# Patient Record
Sex: Female | Born: 1950 | Race: White | Hispanic: No
Health system: Southern US, Community
[De-identification: ages and names within clinical notes are randomized; demographics above are authoritative.]

## PROBLEM LIST (undated history)

## (undated) DIAGNOSIS — R002 Palpitations: Secondary | ICD-10-CM

---

## 2008-05-03 ENCOUNTER — Encounter: Admission: RE | Admit: 2008-05-03 | Discharge: 2008-05-03 | Payer: Self-pay | Admitting: Family Medicine

## 2008-08-24 ENCOUNTER — Other Ambulatory Visit: Admission: RE | Admit: 2008-08-24 | Discharge: 2008-08-24 | Payer: Self-pay | Admitting: Family Medicine

## 2016-10-01 DIAGNOSIS — H25813 Combined forms of age-related cataract, bilateral: Secondary | ICD-10-CM | POA: Diagnosis not present

## 2016-10-01 DIAGNOSIS — H04123 Dry eye syndrome of bilateral lacrimal glands: Secondary | ICD-10-CM | POA: Diagnosis not present

## 2016-10-01 DIAGNOSIS — H5213 Myopia, bilateral: Secondary | ICD-10-CM | POA: Diagnosis not present

## 2017-04-15 ENCOUNTER — Emergency Department (HOSPITAL_COMMUNITY)
Admission: EM | Admit: 2017-04-15 | Discharge: 2017-04-15 | Disposition: A | Discharge status: Home / Self Care | Payer: PPO | Attending: Emergency Medicine | Admitting: Emergency Medicine

## 2017-04-15 ENCOUNTER — Encounter (HOSPITAL_COMMUNITY): Payer: Self-pay | Admitting: *Deleted

## 2017-04-15 DIAGNOSIS — R002 Palpitations: Secondary | ICD-10-CM | POA: Diagnosis present

## 2017-04-15 DIAGNOSIS — Z79899 Other long term (current) drug therapy: Secondary | ICD-10-CM | POA: Diagnosis not present

## 2017-04-15 DIAGNOSIS — I471 Supraventricular tachycardia: Secondary | ICD-10-CM | POA: Diagnosis not present

## 2017-04-15 HISTORY — DX: Palpitations: R00.2

## 2017-04-15 NOTE — ED Provider Notes (Signed)
[facility] EMERGENCY DEPARTMENT Provider Note  CSN: [id number] Arrival date & time: 04/15/17 1324  Chief Complaint(s) SVT  HPI Kelli Mcdaniel is a 66 y.o. female with a history of SVT presents to the emergency department with palpitations.  Symptoms started 2 hours prior to arrival.  Similar to her prior SVT episodes.  Denies any chest pain, shortness of breath, headache, recent fevers or infections.  No nausea or vomiting.  No abdominal pain.  No alleviating or aggravating factors.  She denies any other physical complaints.  HPI  Past Medical History Past Medical History:  Diagnosis Date  . Palpitations    There are no active problems to display for this patient.  Home Medication(s) Prior to Admission medications   Medication Sig Start Date End Date Taking? Authorizing Provider  Multiple Vitamin (MULTIVITAMIN) capsule Take 1 capsule by mouth daily.   Yes [provider]  multivitamin-lutein (OCUVITE-LUTEIN) CAPS capsule Take 1 capsule by mouth daily.   Yes [provider]  omega-3 acid ethyl esters (LOVAZA) 1 g capsule Take 1 g by mouth daily.   Yes [provider]                                                                                                                                    Past Surgical History History reviewed. No pertinent surgical history. Family History No family history on file.  Social History Social History  Substance Use Topics  . Smoking status: Not on file  . Smokeless tobacco: Not on file  . Alcohol use Not on file   Allergies Patient has no known allergies.  Review of Systems Review of Systems All other systems are reviewed and are negative for acute change except as noted in the HPI  Physical Exam Vital Signs  I have reviewed the triage vital signs BP 128/86 (BP Location: Left Arm)   Pulse (!) 182   Temp 97.7 F (36.5 C) (Oral)   Resp 16   Ht 5\' 8"  (1.727 m)   Wt 86.2 kg (190 lb)    SpO2 94%   BMI 28.89 kg/m   Physical Exam  Constitutional: She is oriented to person, place, and time. She appears well-developed and well-nourished. No distress.  HENT:  Head: Normocephalic and atraumatic.  Nose: Nose normal.  Eyes: Pupils are equal, round, and reactive to light. Conjunctivae and EOM are normal. Right eye exhibits no discharge. Left eye exhibits no discharge. No scleral icterus.  Neck: Normal range of motion. Neck supple.  Cardiovascular: Regular rhythm.  Tachycardia present.  Exam reveals no gallop and no friction rub.   No murmur heard. Pulmonary/Chest: Effort normal and breath sounds normal. No stridor. No respiratory distress. She has no rales.  Abdominal: Soft. She exhibits no distension. There is no tenderness.  Musculoskeletal: She exhibits no edema or tenderness.  Neurological: She is alert and oriented to person, place, and time.  Skin: Skin is warm and dry. No rash noted. She is not diaphoretic. No erythema.  Psychiatric: She has a normal mood and affect.  Vitals reviewed.   ED Results and Treatments Labs (all labs ordered are listed, but only abnormal results are displayed) Labs Reviewed - No data to display                                                                                                                       EKG  EKG Interpretation  Date/Time:  Tuesday April 15 2017 18:29:26 EDT Ventricular Rate:  182 PR Interval:    QRS Duration: 92 QT Interval:  278 QTC Calculation: 483 R Axis:   -27 Text Interpretation:  Supraventricular tachycardia Marked ST abnormality, possible inferolateral subendocardial injury Abnormal ECG Confirmed by Drema Pryardama, Grecia Lynk [phone]([id number]) on 04/15/2017 6:53:04 PM       EKG Interpretation  Date/Time:  Tuesday April 15 2017 18:55:10 EDT Ventricular Rate:  92 PR Interval:    QRS Duration: 95 QT Interval:  352 QTC Calculation: 436 R Axis:   -28 Text Interpretation:  Sinus rhythm Borderline left axis deviation  Anterior infarct, old resolved SVT Confirmed by Drema Pryardama, Janissa Bertram [phone] on 04/15/2017 8:25:09 PM      Radiology No results found. Pertinent labs & imaging results that were available during my care of the patient were reviewed by me and considered in my medical decision making (see chart for details).  Medications Ordered in ED Medications - No data to display                                                                                                                                  Procedures Procedures  (including critical care time)  Medical Decision Making / ED Course I have reviewed the nursing notes for this encounter and the patient's prior records (if available in EHR or on provided paperwork).    Confirmed SVT with EKG.  Converted to normal sinus rhythm with vagal maneuvers (Trendelenburg position) repeat EKG with normal sinus rhythm without evidence of acute ischemia, dysrhythmias, interval changes.  Monitored without recurrence.  The patient is safe for discharge with strict return precautions.   Final Clinical Impression(s) / ED Diagnoses Final diagnoses:  SVT (supraventricular tachycardia) (HCC)    Disposition: Discharge  Condition: Good  I have discussed the results, Dx and Tx plan with the patient who expressed understanding and agree(s) with the plan.  Discharge instructions discussed at great length. The patient was given strict return precautions who verbalized understanding of the instructions. No further questions at time of discharge.    New Prescriptions   No medications on file    Follow Up: Cardiology/primary care provider   As needed     This chart was dictated using voice recognition software.  Despite best efforts to proofread,  errors can occur which can change the documentation meaning.   Nira Conn, MD 04/15/17 2025

## 2017-04-15 NOTE — ED Triage Notes (Signed)
To ED for eval of palpitations since 1730. States this happens sometimes but it resolves on it's own. Skin w/d, resp e/u

## 2017-07-30 DIAGNOSIS — R030 Elevated blood-pressure reading, without diagnosis of hypertension: Secondary | ICD-10-CM | POA: Diagnosis not present

## 2017-07-30 DIAGNOSIS — Z136 Encounter for screening for cardiovascular disorders: Secondary | ICD-10-CM | POA: Diagnosis not present

## 2017-07-30 DIAGNOSIS — Z0000 Encounter for general adult medical examination without abnormal findings: Secondary | ICD-10-CM | POA: Diagnosis not present

## 2019-02-17 DIAGNOSIS — H25813 Combined forms of age-related cataract, bilateral: Secondary | ICD-10-CM | POA: Diagnosis not present

## 2019-02-17 DIAGNOSIS — H524 Presbyopia: Secondary | ICD-10-CM | POA: Diagnosis not present

## 2019-02-17 DIAGNOSIS — H5213 Myopia, bilateral: Secondary | ICD-10-CM | POA: Diagnosis not present

## 2019-04-08 DIAGNOSIS — R030 Elevated blood-pressure reading, without diagnosis of hypertension: Secondary | ICD-10-CM | POA: Diagnosis not present

## 2019-04-08 DIAGNOSIS — Z0000 Encounter for general adult medical examination without abnormal findings: Secondary | ICD-10-CM | POA: Diagnosis not present

## 2019-04-08 DIAGNOSIS — E559 Vitamin D deficiency, unspecified: Secondary | ICD-10-CM | POA: Diagnosis not present

## 2019-04-08 DIAGNOSIS — E7800 Pure hypercholesterolemia, unspecified: Secondary | ICD-10-CM | POA: Diagnosis not present

## 2020-04-11 DIAGNOSIS — R7309 Other abnormal glucose: Secondary | ICD-10-CM | POA: Diagnosis not present

## 2020-04-11 DIAGNOSIS — Z1211 Encounter for screening for malignant neoplasm of colon: Secondary | ICD-10-CM | POA: Diagnosis not present

## 2020-04-11 DIAGNOSIS — E559 Vitamin D deficiency, unspecified: Secondary | ICD-10-CM | POA: Diagnosis not present

## 2020-04-11 DIAGNOSIS — E2839 Other primary ovarian failure: Secondary | ICD-10-CM | POA: Diagnosis not present

## 2020-04-11 DIAGNOSIS — Z7189 Other specified counseling: Secondary | ICD-10-CM | POA: Diagnosis not present

## 2020-04-11 DIAGNOSIS — R030 Elevated blood-pressure reading, without diagnosis of hypertension: Secondary | ICD-10-CM | POA: Diagnosis not present

## 2020-04-11 DIAGNOSIS — Z23 Encounter for immunization: Secondary | ICD-10-CM | POA: Diagnosis not present

## 2020-04-11 DIAGNOSIS — E7800 Pure hypercholesterolemia, unspecified: Secondary | ICD-10-CM | POA: Diagnosis not present

## 2020-04-11 DIAGNOSIS — Z0000 Encounter for general adult medical examination without abnormal findings: Secondary | ICD-10-CM | POA: Diagnosis not present

## 2020-04-13 DIAGNOSIS — Z1211 Encounter for screening for malignant neoplasm of colon: Secondary | ICD-10-CM | POA: Diagnosis not present

## 2020-04-25 ENCOUNTER — Other Ambulatory Visit: Payer: Self-pay | Admitting: Family Medicine

## 2020-04-25 DIAGNOSIS — E2839 Other primary ovarian failure: Secondary | ICD-10-CM

## 2020-04-26 ENCOUNTER — Other Ambulatory Visit: Payer: Self-pay | Admitting: Family Medicine

## 2020-04-26 DIAGNOSIS — Z1231 Encounter for screening mammogram for malignant neoplasm of breast: Secondary | ICD-10-CM

## 2020-04-27 ENCOUNTER — Ambulatory Visit
Admission: RE | Admit: 2020-04-27 | Discharge: 2020-04-27 | Disposition: A | Discharge status: Home / Self Care | Payer: PPO | Source: Ambulatory Visit | Attending: Family Medicine | Admitting: Family Medicine

## 2020-04-27 ENCOUNTER — Other Ambulatory Visit: Payer: Self-pay

## 2020-04-27 DIAGNOSIS — Z1231 Encounter for screening mammogram for malignant neoplasm of breast: Secondary | ICD-10-CM | POA: Diagnosis not present

## 2020-05-02 ENCOUNTER — Other Ambulatory Visit: Payer: Self-pay | Admitting: Family Medicine

## 2020-05-02 DIAGNOSIS — R928 Other abnormal and inconclusive findings on diagnostic imaging of breast: Secondary | ICD-10-CM

## 2020-05-15 DIAGNOSIS — R945 Abnormal results of liver function studies: Secondary | ICD-10-CM | POA: Diagnosis not present

## 2020-05-16 ENCOUNTER — Other Ambulatory Visit: Payer: Self-pay

## 2020-05-16 ENCOUNTER — Ambulatory Visit
Admission: RE | Admit: 2020-05-16 | Discharge: 2020-05-16 | Disposition: A | Discharge status: Home / Self Care | Payer: PPO | Source: Ambulatory Visit | Attending: Family Medicine | Admitting: Family Medicine

## 2020-05-16 DIAGNOSIS — R922 Inconclusive mammogram: Secondary | ICD-10-CM | POA: Diagnosis not present

## 2020-05-16 DIAGNOSIS — N6012 Diffuse cystic mastopathy of left breast: Secondary | ICD-10-CM | POA: Diagnosis not present

## 2020-05-16 DIAGNOSIS — R928 Other abnormal and inconclusive findings on diagnostic imaging of breast: Secondary | ICD-10-CM

## 2020-08-07 ENCOUNTER — Ambulatory Visit
Admission: RE | Admit: 2020-08-07 | Discharge: 2020-08-07 | Disposition: A | Discharge status: Home / Self Care | Payer: PPO | Source: Ambulatory Visit | Attending: Family Medicine | Admitting: Family Medicine

## 2020-08-07 ENCOUNTER — Other Ambulatory Visit: Payer: PPO

## 2020-08-07 ENCOUNTER — Other Ambulatory Visit: Payer: Self-pay

## 2020-08-07 DIAGNOSIS — E2839 Other primary ovarian failure: Secondary | ICD-10-CM

## 2020-08-07 DIAGNOSIS — Z780 Asymptomatic menopausal state: Secondary | ICD-10-CM | POA: Diagnosis not present

## 2020-08-07 DIAGNOSIS — M85852 Other specified disorders of bone density and structure, left thigh: Secondary | ICD-10-CM | POA: Diagnosis not present

## 2020-10-11 DIAGNOSIS — R7303 Prediabetes: Secondary | ICD-10-CM | POA: Diagnosis not present

## 2020-10-11 DIAGNOSIS — R945 Abnormal results of liver function studies: Secondary | ICD-10-CM | POA: Diagnosis not present

## 2020-10-11 DIAGNOSIS — E7800 Pure hypercholesterolemia, unspecified: Secondary | ICD-10-CM | POA: Diagnosis not present

## 2020-12-14 DIAGNOSIS — Z20822 Contact with and (suspected) exposure to covid-19: Secondary | ICD-10-CM | POA: Diagnosis not present

## 2020-12-14 DIAGNOSIS — U071 COVID-19: Secondary | ICD-10-CM | POA: Diagnosis not present

## 2021-04-18 DIAGNOSIS — R7989 Other specified abnormal findings of blood chemistry: Secondary | ICD-10-CM | POA: Diagnosis not present

## 2021-04-18 DIAGNOSIS — R945 Abnormal results of liver function studies: Secondary | ICD-10-CM | POA: Diagnosis not present

## 2021-04-18 DIAGNOSIS — E7800 Pure hypercholesterolemia, unspecified: Secondary | ICD-10-CM | POA: Diagnosis not present

## 2021-04-18 DIAGNOSIS — R7303 Prediabetes: Secondary | ICD-10-CM | POA: Diagnosis not present

## 2021-04-18 DIAGNOSIS — E559 Vitamin D deficiency, unspecified: Secondary | ICD-10-CM | POA: Diagnosis not present

## 2021-04-20 DIAGNOSIS — R7303 Prediabetes: Secondary | ICD-10-CM | POA: Diagnosis not present

## 2021-04-20 DIAGNOSIS — Z1211 Encounter for screening for malignant neoplasm of colon: Secondary | ICD-10-CM | POA: Diagnosis not present

## 2021-04-20 DIAGNOSIS — E7800 Pure hypercholesterolemia, unspecified: Secondary | ICD-10-CM | POA: Diagnosis not present

## 2021-04-20 DIAGNOSIS — R945 Abnormal results of liver function studies: Secondary | ICD-10-CM | POA: Diagnosis not present

## 2021-04-20 DIAGNOSIS — E559 Vitamin D deficiency, unspecified: Secondary | ICD-10-CM | POA: Diagnosis not present

## 2021-04-20 DIAGNOSIS — R030 Elevated blood-pressure reading, without diagnosis of hypertension: Secondary | ICD-10-CM | POA: Diagnosis not present

## 2021-04-20 DIAGNOSIS — Z0000 Encounter for general adult medical examination without abnormal findings: Secondary | ICD-10-CM | POA: Diagnosis not present

## 2021-04-27 ENCOUNTER — Other Ambulatory Visit (HOSPITAL_COMMUNITY): Payer: Self-pay | Admitting: Family Medicine

## 2021-05-16 ENCOUNTER — Ambulatory Visit (HOSPITAL_COMMUNITY)
Admission: RE | Admit: 2021-05-16 | Discharge: 2021-05-16 | Disposition: A | Discharge status: Home / Self Care | Payer: Self-pay | Source: Ambulatory Visit | Attending: Family Medicine | Admitting: Family Medicine

## 2021-05-16 ENCOUNTER — Other Ambulatory Visit: Payer: Self-pay

## 2021-05-16 DIAGNOSIS — Z136 Encounter for screening for cardiovascular disorders: Secondary | ICD-10-CM | POA: Insufficient documentation

## 2021-05-17 DIAGNOSIS — I10 Essential (primary) hypertension: Secondary | ICD-10-CM | POA: Diagnosis not present

## 2023-05-16 DIAGNOSIS — Z1212 Encounter for screening for malignant neoplasm of rectum: Secondary | ICD-10-CM | POA: Diagnosis not present

## 2023-05-19 DIAGNOSIS — R7303 Prediabetes: Secondary | ICD-10-CM | POA: Diagnosis not present

## 2023-05-19 DIAGNOSIS — Z1211 Encounter for screening for malignant neoplasm of colon: Secondary | ICD-10-CM | POA: Diagnosis not present

## 2023-05-19 DIAGNOSIS — R7989 Other specified abnormal findings of blood chemistry: Secondary | ICD-10-CM | POA: Diagnosis not present

## 2023-05-19 DIAGNOSIS — I10 Essential (primary) hypertension: Secondary | ICD-10-CM | POA: Diagnosis not present

## 2023-05-19 DIAGNOSIS — E7800 Pure hypercholesterolemia, unspecified: Secondary | ICD-10-CM | POA: Diagnosis not present

## 2023-05-19 DIAGNOSIS — E559 Vitamin D deficiency, unspecified: Secondary | ICD-10-CM | POA: Diagnosis not present

## 2023-05-19 DIAGNOSIS — R030 Elevated blood-pressure reading, without diagnosis of hypertension: Secondary | ICD-10-CM | POA: Diagnosis not present

## 2023-05-19 DIAGNOSIS — Z0000 Encounter for general adult medical examination without abnormal findings: Secondary | ICD-10-CM | POA: Diagnosis not present

## 2023-05-19 DIAGNOSIS — M8589 Other specified disorders of bone density and structure, multiple sites: Secondary | ICD-10-CM | POA: Diagnosis not present

## 2023-05-21 ENCOUNTER — Other Ambulatory Visit: Payer: Self-pay | Admitting: Family Medicine

## 2023-05-21 DIAGNOSIS — M8589 Other specified disorders of bone density and structure, multiple sites: Secondary | ICD-10-CM

## 2023-11-14 IMAGING — CT CT CARDIAC CORONARY ARTERY CALCIUM SCORE
3 series · 14 of 20 positions shown, 15 images · non-contrast
Comparison: None.
COMPARISON: None.

Addendum:
EXAM:
OVER-READ INTERPRETATION  CT CHEST

The following report is an over-read performed by radiologist Dr.
Isaura Ning [REDACTED] on 05/16/2021. This
over-read does not include interpretation of cardiac or coronary
anatomy or pathology. The coronary calcium score interpretation by
the cardiologist is attached.
CLINICAL DATA: Cardiovascular Disease Risk stratification
Coronary Calcium Score
TECHNIQUE: A gated, non-contrast computed tomography scan of the heart was
performed using 3mm slice thickness. Axial images were analyzed on a
dedicated workstation. Calcium scoring of the coronary arteries was
performed using the Agatston method.

[Series 3: ax ca scr 70% (id) · axial · 0.32mm/px · z∈[-240,-150]mm · 6 of 64 slices shown]
[im 10/64  vessel]
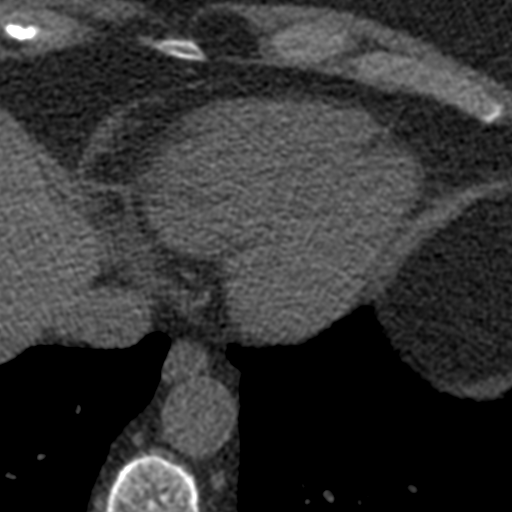
[im 19/64  vessel]
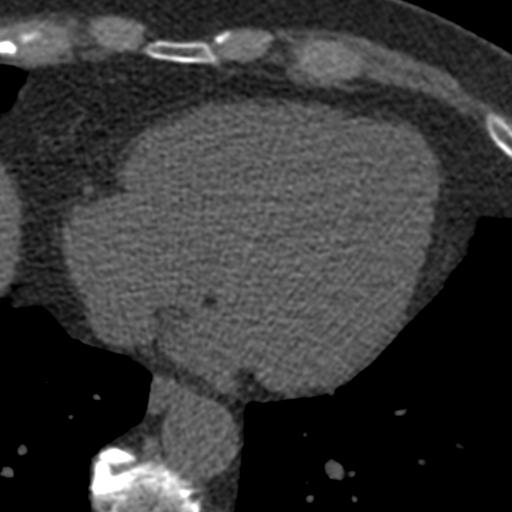
[im 28/64  vessel]
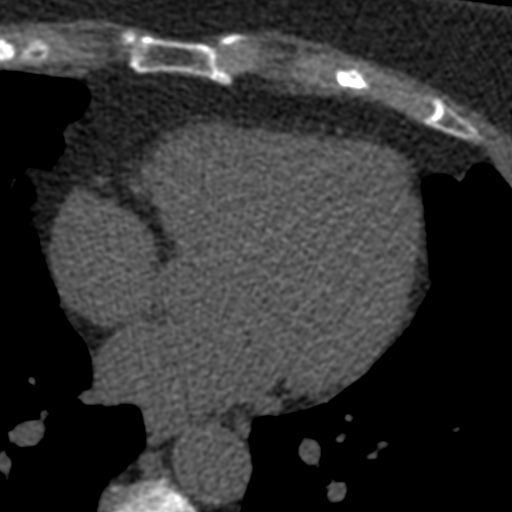
[im 37/64  vessel]
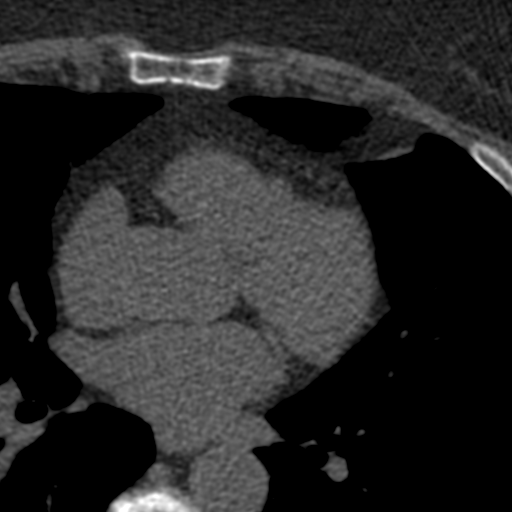
[im 46/64  vessel]
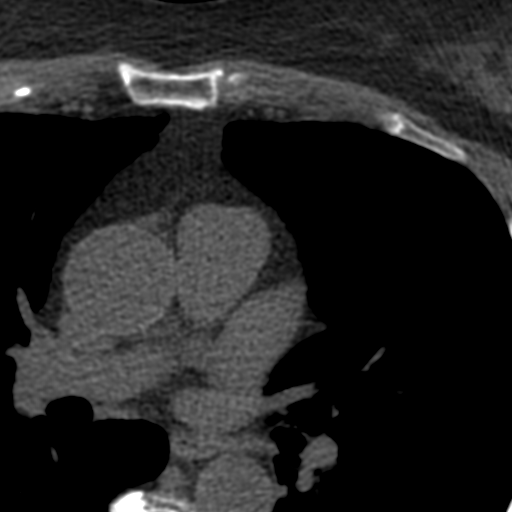
[im 55/64  vessel]
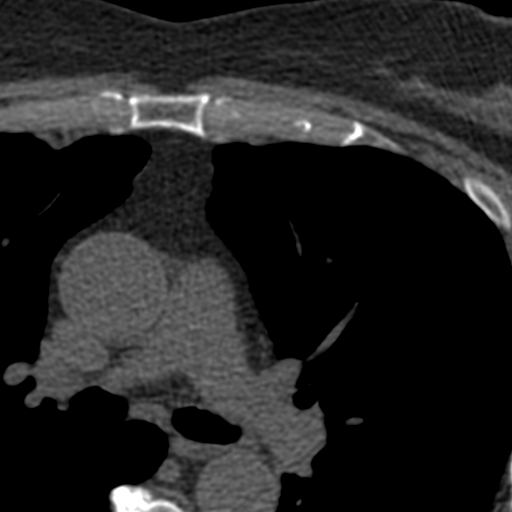

[Series 4: ax lung · axial · 0.70mm/px · z∈[-232,-160]mm · 4 of 42 slices shown]
[im 9/42  lung]
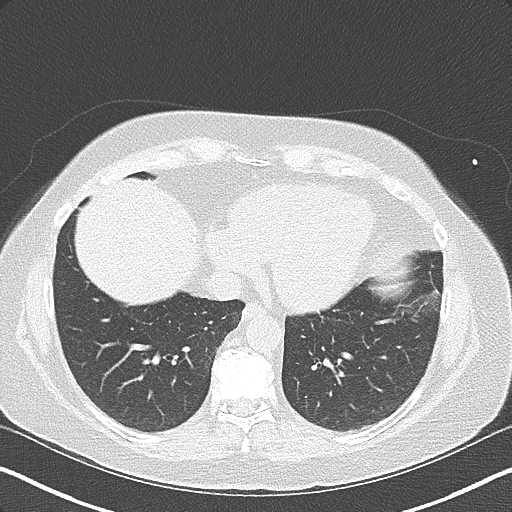
[im 17/42  lung]
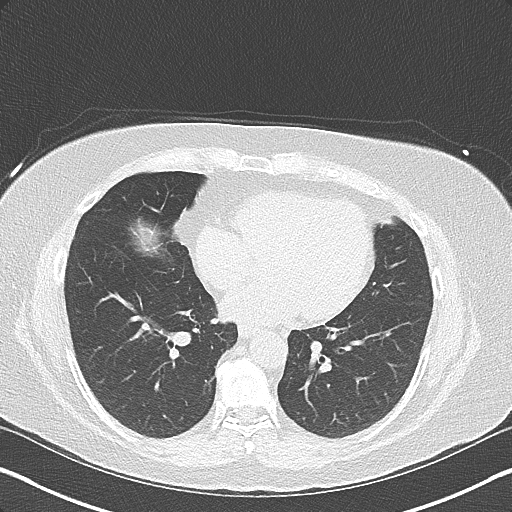
[im 25/42  lung]
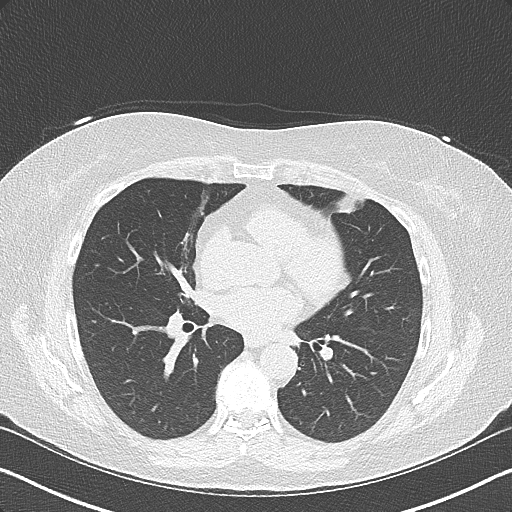
[im 33/42  lung]
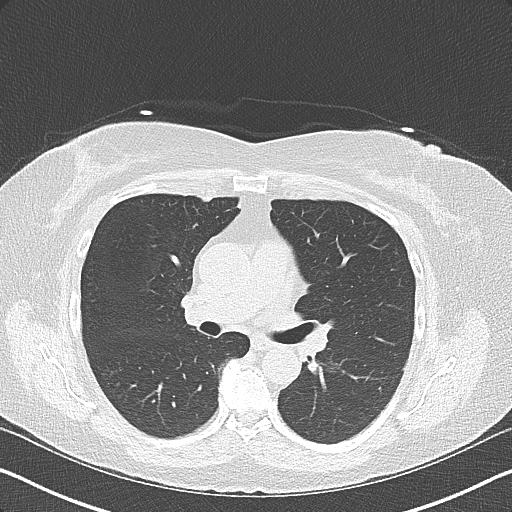

[Series 5: ax st · axial · 0.69mm/px · z∈[-232,-160]mm · 4 of 42 slices shown, 5 images]
[im 9/42  vessel]
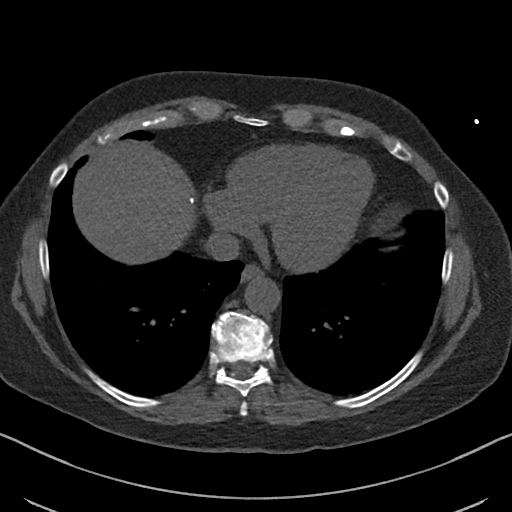
[im 9/42  lung]
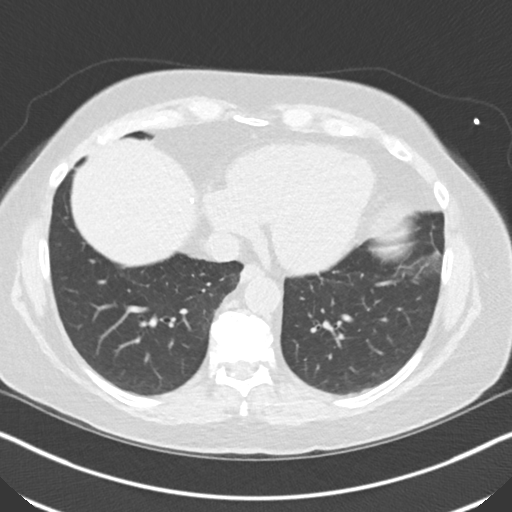
[im 17/42  vessel]
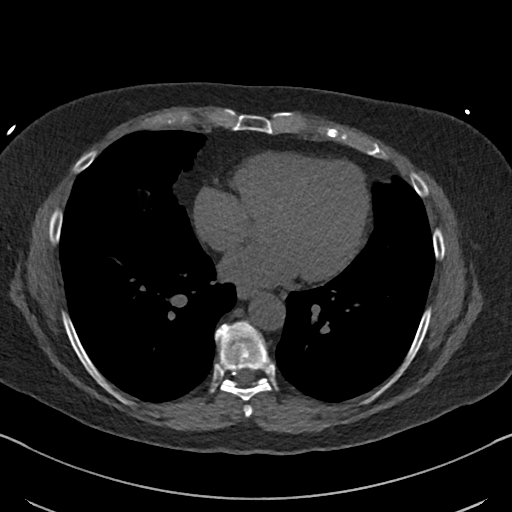
[im 25/42  vessel]
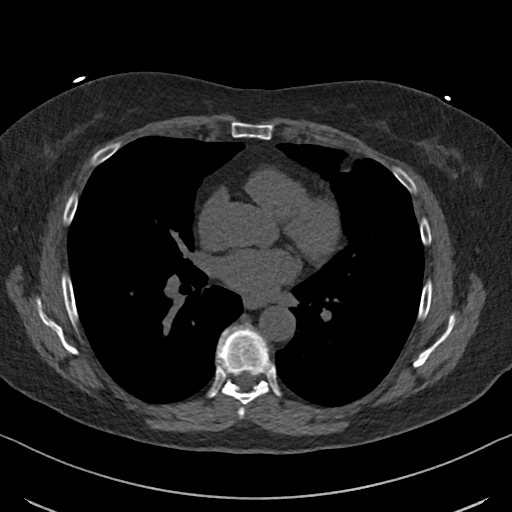
[im 33/42  vessel]
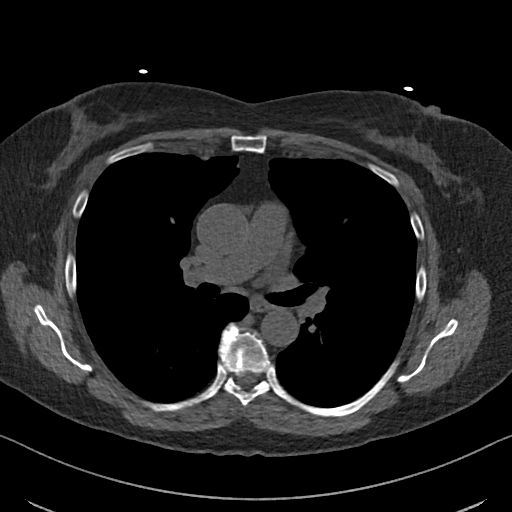

[14 of 20 positions shown; findings below may reference images not displayed]

FINDINGS: Within the visualized portions of the thorax there are no suspicious
appearing pulmonary nodules or masses, there is no acute
consolidative airspace disease, no pleural effusions, no
pneumothorax and no lymphadenopathy. Visualized portions of the
upper abdomen are unremarkable. There are no aggressive appearing
lytic or blastic lesions noted in the visualized portions of the
skeleton.
IMPRESSION: No significant incidental noncardiac findings are noted.
FINDINGS: Coronary arteries: Normal origins.

Coronary Calcium Score:

Left main: 0

Left anterior descending artery: 0

Left circumflex artery: 0

Right coronary artery: 0

Total: 0

Pericardium: Normal.

Ascending Aorta: Normal caliber.

Non-cardiac: See separate report from [REDACTED].
IMPRESSION: Coronary calcium score of 0 Agatston units. This suggests low risk
for future cardiac events.



If CAC=0, it is reasonable to withhold statin therapy and reassess
in 5 to 10 years, as long as higher risk conditions are absent
(diabetes mellitus, family history of premature CHD in first degree
relatives (males <55 years; females <65 years), cigarette smoking,
or LDL >=190 mg/dL).

If CAC is 1 to 99, it is reasonable to initiate statin therapy for
patients >=55 years of age.

If CAC is >=100 or >=75th percentile, it is reasonable to initiate
statin therapy at any age.

Cardiology referral should be considered for patients with CAC
scores >=400 or >=75th percentile.

*2505 AHA/ACC/AACVPR/AAPA/ABC/LACASSE/JOHANN/FERIENHAUS/Mohamd/BAMBOOM/TEANE/PEARLINE
Guideline on the Management of Blood Cholesterol: A Report of the
American College of Cardiology/American Heart Association Task Force
on Clinical Practice Guidelines. J Am Coll Cardiol.
0597;73(24):9171-9671.

*** End of Addendum ***
EXAM:
OVER-READ INTERPRETATION  CT CHEST

The following report is an over-read performed by radiologist Dr.
Isaura Ning [REDACTED] on 05/16/2021. This
over-read does not include interpretation of cardiac or coronary
anatomy or pathology. The coronary calcium score interpretation by
the cardiologist is attached.
FINDINGS: Within the visualized portions of the thorax there are no suspicious
appearing pulmonary nodules or masses, there is no acute
consolidative airspace disease, no pleural effusions, no
pneumothorax and no lymphadenopathy. Visualized portions of the
upper abdomen are unremarkable. There are no aggressive appearing
lytic or blastic lesions noted in the visualized portions of the
skeleton.
IMPRESSION: No significant incidental noncardiac findings are noted.

## 2023-12-21 DIAGNOSIS — H1032 Unspecified acute conjunctivitis, left eye: Secondary | ICD-10-CM | POA: Diagnosis not present

## 2024-02-03 ENCOUNTER — Other Ambulatory Visit: Payer: PPO

## 2024-03-08 ENCOUNTER — Ambulatory Visit (HOSPITAL_BASED_OUTPATIENT_CLINIC_OR_DEPARTMENT_OTHER)
Admission: RE | Admit: 2024-03-08 | Discharge: 2024-03-08 | Disposition: A | Discharge status: Home / Self Care | Source: Ambulatory Visit | Attending: Family Medicine | Admitting: Family Medicine

## 2024-03-08 DIAGNOSIS — Z780 Asymptomatic menopausal state: Secondary | ICD-10-CM | POA: Diagnosis not present

## 2024-03-08 DIAGNOSIS — M8589 Other specified disorders of bone density and structure, multiple sites: Secondary | ICD-10-CM | POA: Diagnosis not present

## 2024-05-19 DIAGNOSIS — Z1212 Encounter for screening for malignant neoplasm of rectum: Secondary | ICD-10-CM | POA: Diagnosis not present
